# Patient Record
Sex: Female | Born: 1935 | Race: White | Hispanic: No
Health system: Southern US, Community
[De-identification: ages and names within clinical notes are randomized; demographics above are authoritative.]

---

## 2006-01-25 ENCOUNTER — Encounter: Admission: RE | Admit: 2006-01-25 | Discharge: 2006-01-25 | Payer: Self-pay | Admitting: Cardiology

## 2006-02-02 ENCOUNTER — Ambulatory Visit (HOSPITAL_COMMUNITY): Admission: RE | Admit: 2006-02-02 | Discharge: 2006-02-02 | Payer: Self-pay | Admitting: Cardiology

## 2011-11-16 ENCOUNTER — Observation Stay: Payer: Self-pay | Admitting: Internal Medicine

## 2011-11-16 LAB — COMPREHENSIVE METABOLIC PANEL
Albumin: 3.2 g/dL — ABNORMAL LOW (ref 3.4–5.0)
Alkaline Phosphatase: 122 U/L (ref 50–136)
Bilirubin,Total: 1 mg/dL (ref 0.2–1.0)
Co2: 27 mmol/L (ref 21–32)
EGFR (Non-African Amer.): 57 — ABNORMAL LOW
Glucose: 134 mg/dL — ABNORMAL HIGH (ref 65–99)
Osmolality: 271 (ref 275–301)
Sodium: 134 mmol/L — ABNORMAL LOW (ref 136–145)
Total Protein: 7.6 g/dL (ref 6.4–8.2)

## 2011-11-16 LAB — URINALYSIS, COMPLETE
Ketone: NEGATIVE
Ph: 6 (ref 4.5–8.0)
Protein: NEGATIVE
RBC,UR: 9 /HPF (ref 0–5)

## 2011-11-16 LAB — PRO B NATRIURETIC PEPTIDE: B-Type Natriuretic Peptide: 3665 pg/mL — ABNORMAL HIGH (ref 0–450)

## 2011-11-16 LAB — CK TOTAL AND CKMB (NOT AT ARMC)
CK, Total: 27 U/L (ref 21–215)
CK-MB: <0.5 ng/mL — ABNORMAL LOW (ref 0.5–3.6)

## 2011-11-16 LAB — CBC
HCT: 43.3 % (ref 35.0–47.0)
HGB: 14.3 g/dL (ref 12.0–16.0)
Platelet: 138 10*3/uL — ABNORMAL LOW (ref 150–440)
RDW: 16.5 % — ABNORMAL HIGH (ref 11.5–14.5)
WBC: 14.4 10*3/uL — ABNORMAL HIGH (ref 3.6–11.0)

## 2011-11-16 LAB — TROPONIN I: Troponin-I: <0.02 ng/mL

## 2011-11-17 LAB — CBC WITH DIFFERENTIAL/PLATELET
Eosinophil %: 3.9 %
HCT: 40.4 % (ref 35.0–47.0)
Lymphocyte #: 1.1 10*3/uL (ref 1.0–3.6)
MCH: 31.7 pg (ref 26.0–34.0)
MCV: 95 fL (ref 80–100)
Monocyte #: 0.8 10*3/uL — ABNORMAL HIGH (ref 0.0–0.7)
Monocyte %: 7 %
Neutrophil #: 9.4 10*3/uL — ABNORMAL HIGH (ref 1.4–6.5)
Platelet: 114 10*3/uL — ABNORMAL LOW (ref 150–440)
RBC: 4.28 10*6/uL (ref 3.80–5.20)
RDW: 17 % — ABNORMAL HIGH (ref 11.5–14.5)
WBC: 11.8 10*3/uL — ABNORMAL HIGH (ref 3.6–11.0)

## 2011-11-17 LAB — BASIC METABOLIC PANEL
Anion Gap: 12 (ref 7–16)
Co2: 29 mmol/L (ref 21–32)
Creatinine: 0.86 mg/dL (ref 0.60–1.30)
EGFR (African American): >60
Osmolality: 284 (ref 275–301)

## 2013-11-09 IMAGING — CR DG CHEST 2V
1 series · 3 of 3 positions shown · non-contrast
Comparison: none

REASON FOR EXAM: Chest Pain
COMMENTS:

PROCEDURE:     DXR - DXR CHEST PA (OR AP) AND LATERAL  - November 16, 2011 [DATE]
RESULT:     Comparison: None

[Series 1: x chest ap · 0.14mm/px · 3 of 3 slices shown]
[im 1/3]
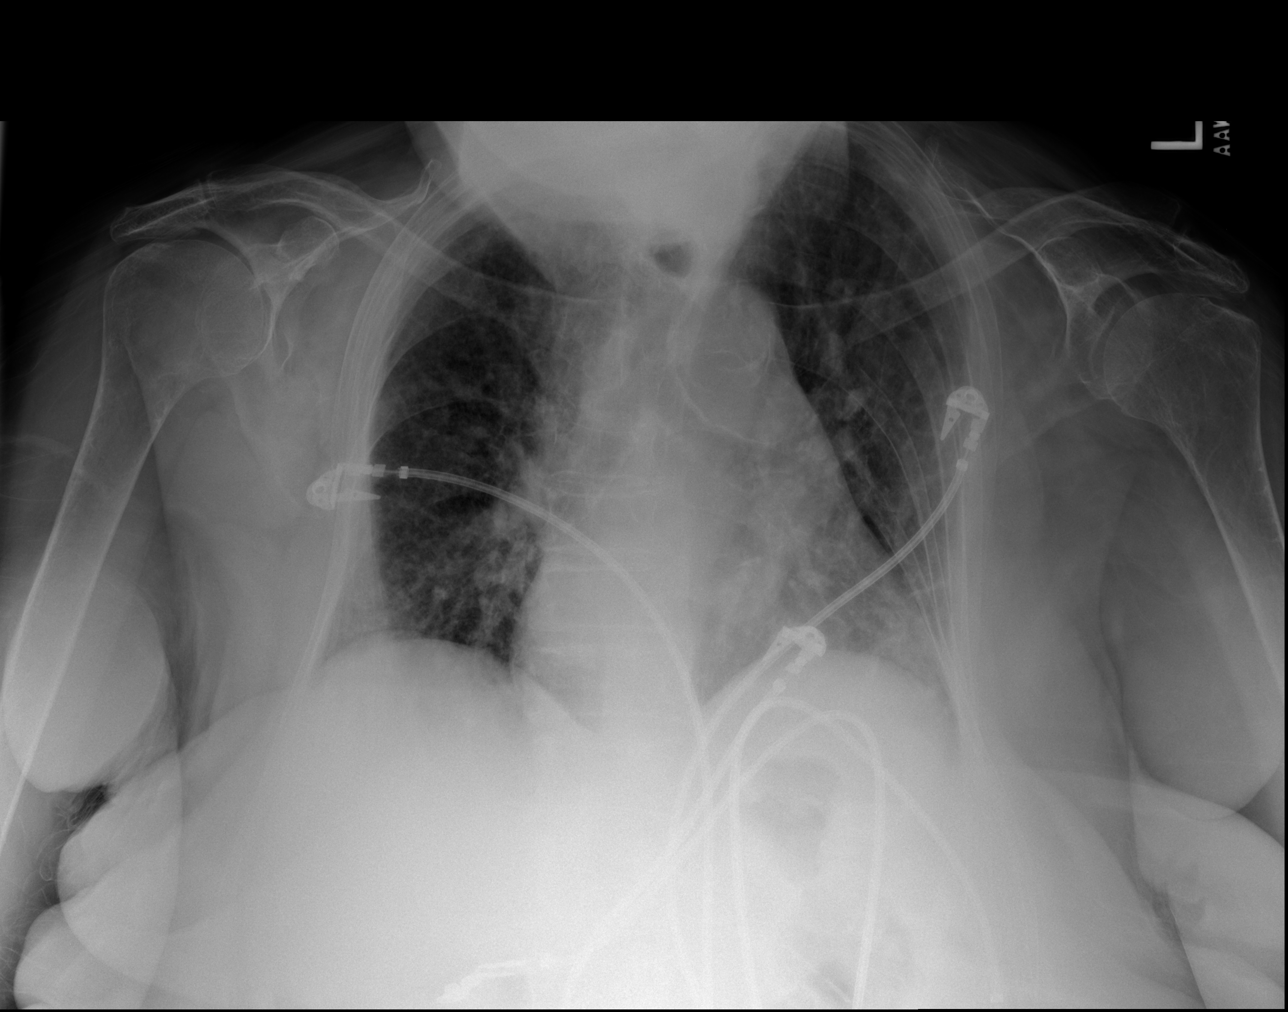
[im 2/3]
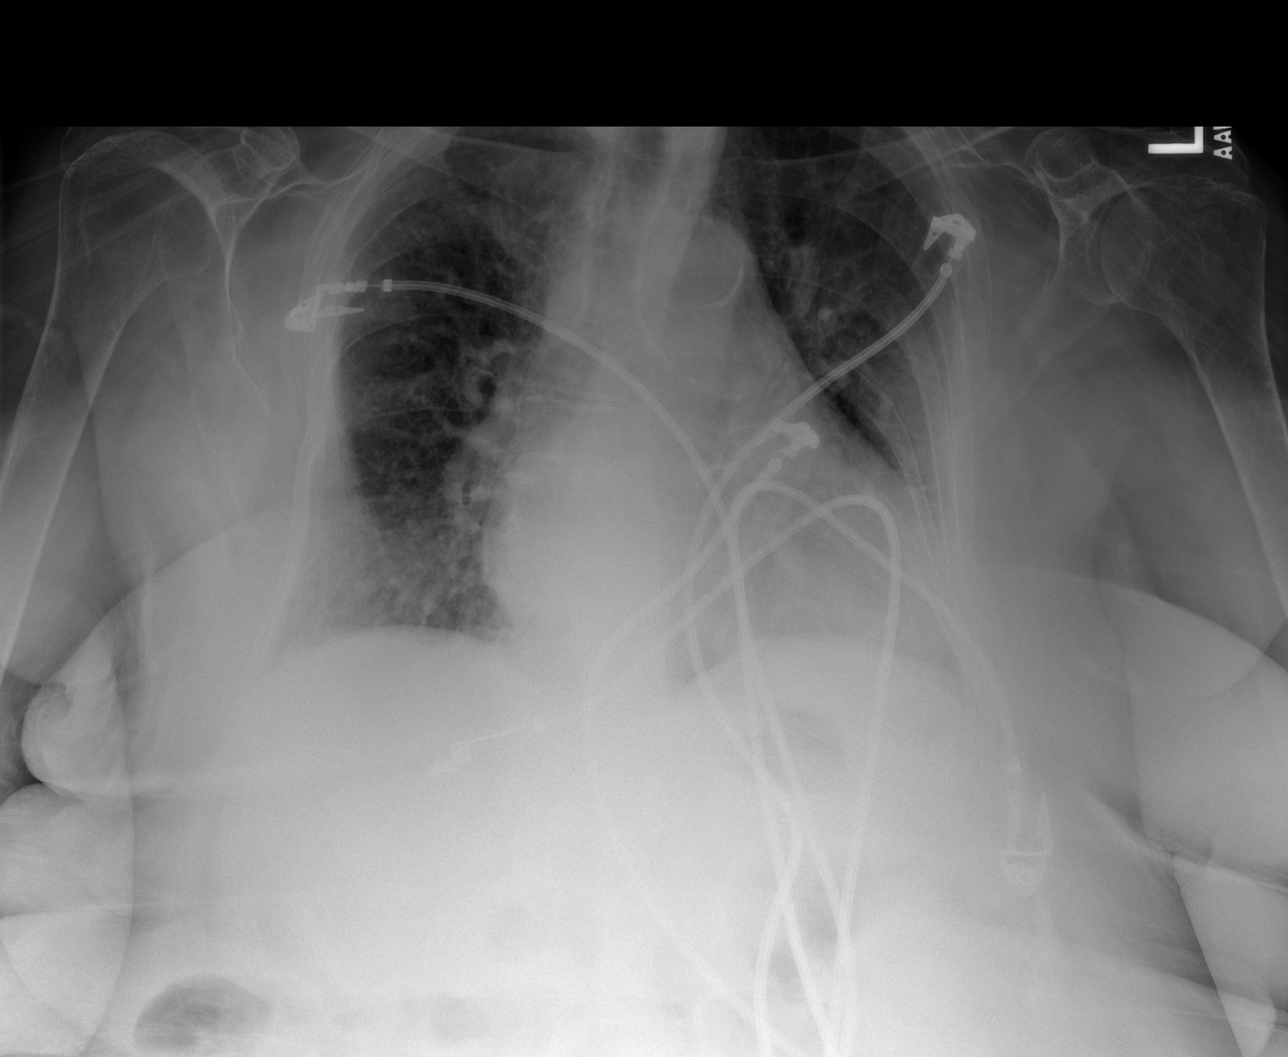
[im 3/3]
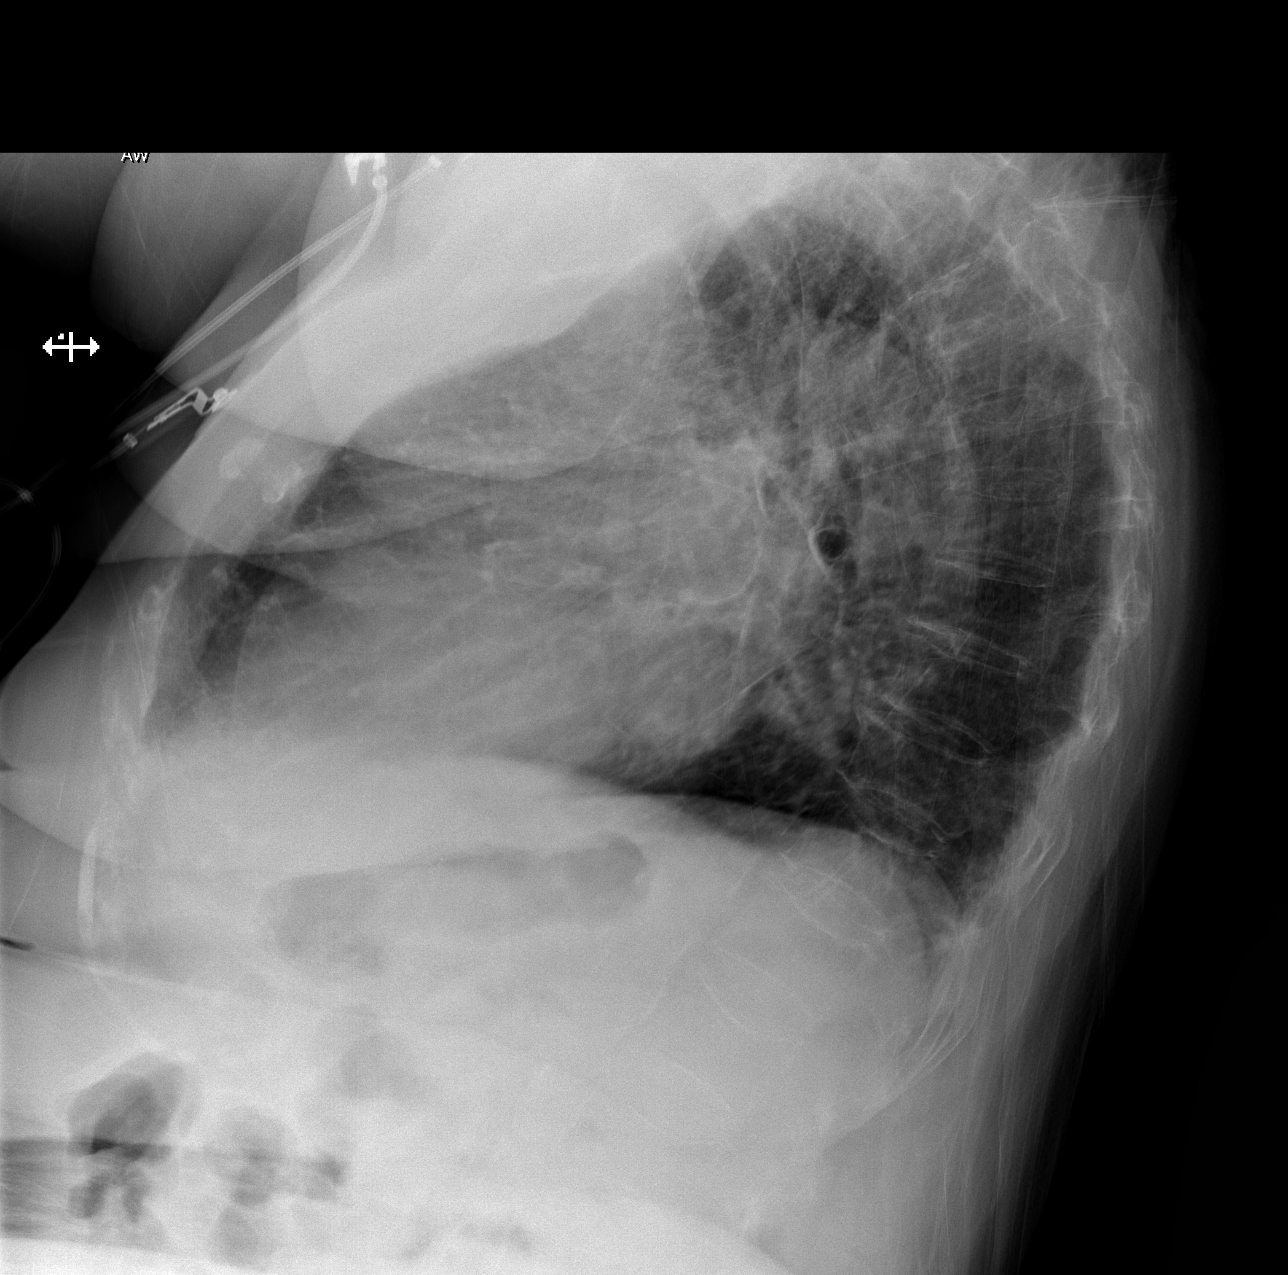

[3 of 3 positions shown; findings below may reference images not displayed]

FINDINGS: PA and lateral chest radiographs are provided. There is bilateral diffuse
interstitial thickening likely representing chronic interstitial disease,
but superimposed mild interstitial edema versus interstitial pneumonitis
secondary to an infectious or inflammatory etiology cannot be excluded.
There is no focal parenchymal opacity, pleural effusion, or pneumothorax.
The heart size is enlarged..  The osseous structures are unremarkable.
IMPRESSION: There is bilateral diffuse interstitial thickening likely representing
chronic interstitial disease, but superimposed mild interstitial edema
versus interstitial pneumonitis secondary to an infectious or inflammatory
etiology cannot be excluded.

[REDACTED]

## 2014-12-20 NOTE — H&P (Signed)
PATIENT NAME:  Sandra Meyers, Sandra Meyers MR#:  [id number] DATE OF BIRTH:  01/30/1924  DATE OF ADMISSION:  11/16/2011  PRIMARY CARE PHYSICIAN:  Located in [place].   CHIEF COMPLAINT: Shortness of breath, weakness, and cough.   HISTORY OF PRESENT ILLNESS: The patient is an 81 year old female who comes in from her sister's house due to shortness of breath, weakness, cough. The patient is actually visiting from [place] and came into town yesterday as she had not been feeling well. She describes her symptoms as having cough which is productive with some yellow sputum, also some weakness and progressive shortness of breath. She denies any nausea or vomiting, admits to poor p.o. intake, and also complains of a strong smelling urine. She denies any dysuria or hematuria associated with symptoms. She admits to some chills. She was brought to the hospital and noted to be mildly hypoxic. Hospitalist Services were then contacted for further treatment and evaluation.   REVIEW OF SYSTEMS: CONSTITUTIONAL: No documented fever. No weight gain, no weight loss. EYES: No blurred or double vision. ENT: No tinnitus or postnasal drip. No redness of the oropharynx. RESPIRATORY: Positive cough. No wheeze, no hemoptysis. Positive dyspnea. CARDIOVASCULAR: No chest pain, no orthopnea, no palpitations, no syncope. GASTROINTESTINAL: No nausea, no vomiting, no diarrhea, no abdominal pain, no melena or hematochezia. GU: No dysuria or hematuria. ENDOCRINE: No polyuria or nocturia. No heat or cold intolerance. HEMATOLOGIC: No anemia, no bruising, no bleeding. INTEGUMENTARY: No rashes. No lesions. MUSCULOSKELETAL: No arthritis, no swelling, and no gout. NEUROLOGIC: No numbness, no tingling. No ataxia. No seizure-type activity. PSYCHIATRIC:  No anxiety, no insomnia, no ADD.   PAST MEDICAL HISTORY:  1. Chronic atrial fibrillation. 2. Hypertension. 3. Rheumatoid arthritis. 4. Hypothyroidism. 5. History of congestive heart failure.  6. Depression.    ALLERGIES: No known drug allergies.   SOCIAL HISTORY: No smoking. No alcohol abuse. No illicit drug abuse. She lives in [place] by herself.   FAMILY HISTORY: Both mother and father died from complications of heart disease.   CURRENT MEDICATIONS:  1. Celexa 20 mg daily.  2. Digoxin 125 mcg daily.  3. Flonase 1 spray to each nostril at bedtime.  4. Folic acid 1 mg daily.  5. Synthroid 100 mcg daily.  6. Loratadine 10 mg daily.  7. Methotrexate 2.5 mg, 5 tabs weekly.  8. Metoprolol 25 mg b.i.d.  9. VESIcare 5 mg daily.   PHYSICAL EXAMINATION:  VITAL SIGNS: On admission, temperature is 96.7, pulse 98, respirations 18, blood pressure 186/86, saturations 95% on room air.   GENERAL: She is a pleasant-appearing female in no apparent distress.   HEENT: Atraumatic, normocephalic. Her extraocular muscles are intact. The pupils are equal and reactive to light. Sclerae are anicteric. There is no conjunctival injection. No pharyngeal erythema.   NECK: Supple. No jugular venous distention. No bruits, no lymphadenopathy, no thyromegaly.   HEART: Irregular. No murmurs, no rubs, no clicks.   LUNGS: Clear to auscultation bilaterally. No rales, no rhonchi, no wheezes.   ABDOMEN: Soft, flat, nontender, nondistended, has good bowel sounds. No hepatosplenomegaly appreciated.   EXTREMITIES: There is no evidence of any cyanosis, clubbing, or peripheral edema. Has +2 pedal and radial pulses bilaterally.  NEUROLOGICAL: The patient is alert, awake, and oriented x3 with no focal motor or sensory deficits appreciated bilaterally.  SKIN: Exam is moist and warm with no rash appreciated.   LYMPHATIC: There is no cervical or axillary lymphadenopathy.   LABORATORY, DIAGNOSTIC AND RADIOLOGICAL DATA:  Serum glucose 134, BNP 3665, BUN  15, creatinine 0.9, sodium 134, potassium 3.5, chloride 98, bicarbonate 27.  The patient's liver function tests are within normal limits.  Troponin less than 0.02.  White  cell count 14.4, hemoglobin 14.3, hematocrit 43.3, platelet count of 138.  The patient did have a chest x-ray done which showed bilateral diffuse interstitial thickening likely representing chronic interstitial disease but superimposed mild interstitial edema versus interstitial pneumonitis.   ASSESSMENT AND PLAN: The patient is an 81 year old female with a past medical history of chronic atrial fibrillation, hypertension, rheumatoid arthritis, hypothyroidism, depression, presents to the hospital with shortness of breath, weakness and cough, and also noted to have a strong smelling urine.   1. Shortness of breath and cough: The exact etiology is currently unclear, questionable if this is mild congestive heart failure versus suspected underlying upper respiratory infection. She did have some chills. She does have some sputum production with yellow sputum. Chest x-ray findings are infectious versus noninfectious mild interstitial edema. Therefore, I will treat her for both for now, continue some gentle diuresis with Lasix daily, also give her Levaquin for bronchitis and follow her clinically. Follow ins and outs and follow daily weights.  2. Generalized weakness: I suspect this is possibly due to bronchitis, or underlying congestive heart failure, or even underlying urinary tract infection. She does have a strong smelling urine. I will treat her both for the congestive heart failure/bronchitis and urinary tract infection as mentioned. I am awaiting a urinalysis presently to see if she truly has a urinary tract infection.  3. Hypothyroidism: Continue with Synthroid.  4. Hypertension: Continue metoprolol.  5. Chronic atrial fibrillation: The patient is currently rate controlled with digoxin and metoprolol. She is not taking anticoagulation because of some history of bleeding.  6. Hypothyroidism: Continue with Synthroid.  7. Depression: Continue with Celexa.  CODE STATUS:  The patient is a FULL CODE.      The plan was discussed with the patient and the patient's son, and he is in agreement.   TIME SPENT: 50 minutes.   ____________________________ Rolly Pancake. Cherlynn Kaiser, MD vjs:cbb D: 11/16/2011 14:37:58 ET T: 11/16/2011 15:06:35 ET JOB#: [id number]  cc: Rolly Pancake. Cherlynn Kaiser, MD, <Dictator> Houston Siren MD ELECTRONICALLY SIGNED 11/17/2011 18:45

## 2014-12-20 NOTE — Discharge Summary (Signed)
PATIENT NAME:  Sandra Meyers, Sandra Meyers MR#:  [id number] DATE OF BIRTH:  [birth year]  DATE OF ADMISSION:  11/16/2011 DATE OF DISCHARGE:  11/17/2011  DISCHARGE DIAGNOSES:  1. Multifactorial shortness of breath. Differential diagnosis includes mild congestive heart failure, possibly diastolic, due to history of hypertension versus bronchitis/interstitial pneumonitis. 2. Generalized weakness due to above. 3. Pyuria without bacteruria.  4. Hypothyroidism. 5. Hypertension.  6. Chronic atrial fibrillation, rate controlled.  7. Depression.   DISPOSITION: The patient is being discharged home to followup with PCP in [place].   DIET: Low sodium.   ACTIVITY: As tolerated.   DISCHARGE FOLLOWUP: Followup with PCP in 1 to 2 weeks after discharge.   DISCHARGE MEDICATIONS:  1. Lasix 20 mg daily.  2. Levaquin 500 mg daily for seven days. 3. Tussionex 5 mL p.o. twice a day p.r.n. cough. 4. Loratadine 10 mg daily. 5. Metoprolol titrate 50 mg 1 tablet in the morning and 1/2 tablet in the evening. 6. Citalopram 20 mg daily.  7. Folic acid 1 mg daily.  8. Digoxin 125 mcg daily.  9. VESIcare 5 mg daily. 10. Levothyroxine 100 mcg daily.  11. Flonase one spray once daily at bedtime. 12. Methotrexate 2.5 mg five tablets once a week on the same day.   RESULTS: Chest x-ray, PA and lateral: Bilateral diffuse interstitial thickening likely representing chronic interstitial disease with superimposed mild interstitial edema versus interstitial pneumonitis.   EKG showed atrial fibrillation with incomplete left bundle branch block.   Urinalysis showed pyuria without bacteruria.   CBC: White count 14.4 on admission and 11.8 by the time of discharge. Chronic thrombocytopenia. Platelet count 138 to 114. Normal CBC.   Glucose 134 to 111. BNP 3665. CMP essentially normal. Cardiac enzymes negative. Hepatic profile normal.   HOSPITAL COURSE: The patient is an 81 year old female with past medical history of chronic atrial  fibrillation, hypothyroidism, rheumatoid arthritis, and history of congestive heart failure who presented with cough and shortness of breath. She was admitted with the diagnosis of shortness of breath and cough. The etiology was unclear, however, it included mild congestive heart failure, possibly diastolic due to history of hypertension versus bronchitis/interstitial pneumonitis seen on chest x-ray. The patient was admitted to the hospital and started on empiric antibiotics. She remained clinically stable and afebrile. Her white count improved. She was also treated with low-dose Lasix with improvement in her symptoms. She did continue to have some dry coughing possibly due to bronchitis and was treated with Tussionex. The patient had generalized weakness likely due to above symptoms. As per the patient's son, she is supposed to be placed in a rest home in [place] very soon. The patient had pyuria without bacteruria. She denied any dysuria. Levaquin will empirically cover the pyuria. The rest of the patient's symptoms remained well controlled during the hospitalization. Her blood pressure was well  controlled, her atrial fibrillation remained controlled, and she is being discharged home in a stable condition. She is awaiting placement in a rest home in [place].   TIME SPENT: 45 minutes. ____________________________ Darrick MeigsSangeeta Venus Gilles, MD sp:slb D: 11/17/2011 15:08:58 ET T: 11/17/2011 16:12:42 ET JOB#: [id number]  cc: Darrick MeigsSangeeta Jacobo Moncrief, MD, <Dictator> Darrick MeigsSANGEETA Elvi Leventhal MD ELECTRONICALLY SIGNED 11/18/2011 15:10

## 2017-02-25 DEATH — deceased
